# Patient Record
Sex: Female | Born: 1970 | Race: White | Hispanic: No | Smoking: Never smoker
Health system: Southern US, Community
[De-identification: ages and names within clinical notes are randomized; demographics above are authoritative.]

## PROBLEM LIST (undated history)

## (undated) DIAGNOSIS — M7989 Other specified soft tissue disorders: Secondary | ICD-10-CM

## (undated) DIAGNOSIS — F329 Major depressive disorder, single episode, unspecified: Secondary | ICD-10-CM

## (undated) DIAGNOSIS — M1990 Unspecified osteoarthritis, unspecified site: Secondary | ICD-10-CM

## (undated) DIAGNOSIS — F32A Depression, unspecified: Secondary | ICD-10-CM

## (undated) HISTORY — DX: Unspecified osteoarthritis, unspecified site: M19.90

## (undated) HISTORY — DX: Depression, unspecified: F32.A

## (undated) HISTORY — DX: Other specified soft tissue disorders: M79.89

## (undated) HISTORY — PX: TUBAL LIGATION: SHX77

## (undated) HISTORY — DX: Major depressive disorder, single episode, unspecified: F32.9

## (undated) HISTORY — PX: CHOLECYSTECTOMY: SHX55

---

## 1997-06-19 ENCOUNTER — Emergency Department (HOSPITAL_COMMUNITY): Admission: EM | Admit: 1997-06-19 | Discharge: 1997-06-19 | Payer: Self-pay | Admitting: *Deleted

## 1997-10-07 ENCOUNTER — Emergency Department (HOSPITAL_COMMUNITY): Admission: EM | Admit: 1997-10-07 | Discharge: 1997-10-07 | Payer: Self-pay | Admitting: Emergency Medicine

## 1998-05-08 ENCOUNTER — Other Ambulatory Visit: Admission: RE | Admit: 1998-05-08 | Discharge: 1998-05-08 | Payer: Self-pay | Admitting: Gynecology

## 1998-08-20 ENCOUNTER — Emergency Department (HOSPITAL_COMMUNITY): Admission: EM | Admit: 1998-08-20 | Discharge: 1998-08-21 | Payer: Self-pay | Admitting: Emergency Medicine

## 1998-10-11 ENCOUNTER — Other Ambulatory Visit: Admission: RE | Admit: 1998-10-11 | Discharge: 1998-10-11 | Payer: Self-pay | Admitting: Gynecology

## 1998-12-26 ENCOUNTER — Emergency Department (HOSPITAL_COMMUNITY): Admission: EM | Admit: 1998-12-26 | Discharge: 1998-12-26 | Payer: Self-pay | Admitting: *Deleted

## 1999-02-07 ENCOUNTER — Encounter: Payer: Self-pay | Admitting: Gynecology

## 1999-02-07 ENCOUNTER — Ambulatory Visit (HOSPITAL_COMMUNITY): Admission: RE | Admit: 1999-02-07 | Discharge: 1999-02-07 | Payer: Self-pay | Admitting: Gynecology

## 1999-02-15 ENCOUNTER — Encounter: Payer: Self-pay | Admitting: Gynecology

## 1999-02-15 ENCOUNTER — Ambulatory Visit (HOSPITAL_COMMUNITY): Admission: RE | Admit: 1999-02-15 | Discharge: 1999-02-15 | Payer: Self-pay | Admitting: Gynecology

## 1999-03-29 ENCOUNTER — Inpatient Hospital Stay (HOSPITAL_COMMUNITY): Admission: AD | Admit: 1999-03-29 | Discharge: 1999-03-29 | Payer: Self-pay | Admitting: Gynecology

## 1999-04-18 ENCOUNTER — Inpatient Hospital Stay (HOSPITAL_COMMUNITY): Admission: AD | Admit: 1999-04-18 | Discharge: 1999-04-18 | Payer: Self-pay | Admitting: Gynecology

## 1999-04-20 ENCOUNTER — Inpatient Hospital Stay (HOSPITAL_COMMUNITY): Admission: AD | Admit: 1999-04-20 | Discharge: 1999-04-20 | Payer: Self-pay | Admitting: Gynecology

## 1999-04-23 ENCOUNTER — Encounter: Payer: Self-pay | Admitting: Gynecology

## 1999-04-23 ENCOUNTER — Encounter (HOSPITAL_COMMUNITY): Admission: EM | Admit: 1999-04-23 | Discharge: 1999-05-09 | Payer: Self-pay | Admitting: Internal Medicine

## 1999-04-25 ENCOUNTER — Inpatient Hospital Stay (HOSPITAL_COMMUNITY): Admission: AD | Admit: 1999-04-25 | Discharge: 1999-04-25 | Payer: Self-pay | Admitting: Gynecology

## 1999-05-08 ENCOUNTER — Inpatient Hospital Stay (HOSPITAL_COMMUNITY): Admission: AD | Admit: 1999-05-08 | Discharge: 1999-05-11 | Payer: Self-pay | Admitting: Gynecology

## 1999-06-20 ENCOUNTER — Other Ambulatory Visit: Admission: RE | Admit: 1999-06-20 | Discharge: 1999-06-20 | Payer: Self-pay | Admitting: Gynecology

## 2000-03-05 ENCOUNTER — Other Ambulatory Visit: Admission: RE | Admit: 2000-03-05 | Discharge: 2000-03-05 | Payer: Self-pay | Admitting: *Deleted

## 2000-03-05 ENCOUNTER — Encounter: Admission: RE | Admit: 2000-03-05 | Discharge: 2000-03-05 | Payer: Self-pay | Admitting: Family Medicine

## 2000-04-15 ENCOUNTER — Encounter: Admission: RE | Admit: 2000-04-15 | Discharge: 2000-04-15 | Payer: Self-pay | Admitting: Family Medicine

## 2000-04-15 ENCOUNTER — Ambulatory Visit (HOSPITAL_COMMUNITY): Admission: RE | Admit: 2000-04-15 | Discharge: 2000-04-15 | Payer: Self-pay | Admitting: *Deleted

## 2000-05-14 ENCOUNTER — Encounter: Admission: RE | Admit: 2000-05-14 | Discharge: 2000-05-14 | Payer: Self-pay | Admitting: Family Medicine

## 2000-06-02 ENCOUNTER — Encounter: Admission: RE | Admit: 2000-06-02 | Discharge: 2000-06-02 | Payer: Self-pay | Admitting: Family Medicine

## 2000-06-11 ENCOUNTER — Ambulatory Visit (HOSPITAL_COMMUNITY): Admission: RE | Admit: 2000-06-11 | Discharge: 2000-06-11 | Payer: Self-pay | Admitting: Family Medicine

## 2000-06-19 ENCOUNTER — Encounter: Admission: RE | Admit: 2000-06-19 | Discharge: 2000-06-19 | Payer: Self-pay | Admitting: Family Medicine

## 2000-06-20 ENCOUNTER — Inpatient Hospital Stay (HOSPITAL_COMMUNITY): Admission: AD | Admit: 2000-06-20 | Discharge: 2000-06-20 | Payer: Self-pay | Admitting: *Deleted

## 2000-07-02 ENCOUNTER — Encounter: Admission: RE | Admit: 2000-07-02 | Discharge: 2000-07-02 | Payer: Self-pay | Admitting: Family Medicine

## 2000-07-14 ENCOUNTER — Ambulatory Visit (HOSPITAL_COMMUNITY): Admission: RE | Admit: 2000-07-14 | Discharge: 2000-07-14 | Payer: Self-pay | Admitting: *Deleted

## 2000-08-01 ENCOUNTER — Encounter: Admission: RE | Admit: 2000-08-01 | Discharge: 2000-08-01 | Payer: Self-pay | Admitting: Family Medicine

## 2000-08-19 ENCOUNTER — Encounter: Admission: RE | Admit: 2000-08-19 | Discharge: 2000-08-19 | Payer: Self-pay | Admitting: Family Medicine

## 2000-09-01 ENCOUNTER — Encounter: Admission: RE | Admit: 2000-09-01 | Discharge: 2000-09-01 | Payer: Self-pay | Admitting: Family Medicine

## 2000-09-19 ENCOUNTER — Encounter: Admission: RE | Admit: 2000-09-19 | Discharge: 2000-09-19 | Payer: Self-pay | Admitting: Family Medicine

## 2000-09-29 ENCOUNTER — Encounter: Payer: Self-pay | Admitting: Family Medicine

## 2000-09-29 ENCOUNTER — Ambulatory Visit (HOSPITAL_COMMUNITY): Admission: RE | Admit: 2000-09-29 | Discharge: 2000-09-29 | Payer: Self-pay | Admitting: Family Medicine

## 2000-10-02 ENCOUNTER — Encounter: Admission: RE | Admit: 2000-10-02 | Discharge: 2000-10-02 | Payer: Self-pay | Admitting: Sports Medicine

## 2000-10-04 ENCOUNTER — Observation Stay (HOSPITAL_COMMUNITY): Admission: AD | Admit: 2000-10-04 | Discharge: 2000-10-05 | Payer: Self-pay | Admitting: Obstetrics

## 2000-10-04 ENCOUNTER — Encounter: Payer: Self-pay | Admitting: Obstetrics

## 2000-10-05 ENCOUNTER — Encounter: Payer: Self-pay | Admitting: Obstetrics

## 2000-10-07 ENCOUNTER — Encounter: Admission: RE | Admit: 2000-10-07 | Discharge: 2000-10-07 | Payer: Self-pay | Admitting: Family Medicine

## 2000-10-14 ENCOUNTER — Inpatient Hospital Stay (HOSPITAL_COMMUNITY): Admission: AD | Admit: 2000-10-14 | Discharge: 2000-10-14 | Payer: Self-pay | Admitting: *Deleted

## 2000-10-14 ENCOUNTER — Encounter: Admission: RE | Admit: 2000-10-14 | Discharge: 2000-10-14 | Payer: Self-pay | Admitting: Family Medicine

## 2000-10-21 ENCOUNTER — Encounter: Admission: RE | Admit: 2000-10-21 | Discharge: 2000-10-21 | Payer: Self-pay | Admitting: Family Medicine

## 2000-10-30 ENCOUNTER — Encounter: Admission: RE | Admit: 2000-10-30 | Discharge: 2000-10-30 | Payer: Self-pay | Admitting: Family Medicine

## 2000-10-31 ENCOUNTER — Encounter (HOSPITAL_COMMUNITY): Admission: RE | Admit: 2000-10-31 | Discharge: 2000-10-31 | Payer: Self-pay | Admitting: *Deleted

## 2000-11-02 ENCOUNTER — Inpatient Hospital Stay (HOSPITAL_COMMUNITY): Admission: AD | Admit: 2000-11-02 | Discharge: 2000-11-06 | Payer: Self-pay | Admitting: *Deleted

## 2000-11-02 ENCOUNTER — Encounter (INDEPENDENT_AMBULATORY_CARE_PROVIDER_SITE_OTHER): Payer: Self-pay

## 2000-12-07 ENCOUNTER — Encounter (INDEPENDENT_AMBULATORY_CARE_PROVIDER_SITE_OTHER): Payer: Self-pay | Admitting: *Deleted

## 2000-12-07 LAB — CONVERTED CEMR LAB

## 2000-12-18 ENCOUNTER — Encounter (INDEPENDENT_AMBULATORY_CARE_PROVIDER_SITE_OTHER): Payer: Self-pay

## 2000-12-18 ENCOUNTER — Encounter: Admission: RE | Admit: 2000-12-18 | Discharge: 2000-12-18 | Payer: Self-pay | Admitting: Family Medicine

## 2000-12-25 ENCOUNTER — Encounter: Admission: RE | Admit: 2000-12-25 | Discharge: 2000-12-25 | Payer: Self-pay | Admitting: Family Medicine

## 2001-02-17 ENCOUNTER — Encounter: Admission: RE | Admit: 2001-02-17 | Discharge: 2001-02-17 | Payer: Self-pay | Admitting: Family Medicine

## 2001-04-14 ENCOUNTER — Encounter: Admission: RE | Admit: 2001-04-14 | Discharge: 2001-04-14 | Payer: Self-pay | Admitting: Family Medicine

## 2001-06-04 ENCOUNTER — Encounter: Admission: RE | Admit: 2001-06-04 | Discharge: 2001-06-04 | Payer: Self-pay | Admitting: Family Medicine

## 2002-02-15 ENCOUNTER — Encounter: Admission: RE | Admit: 2002-02-15 | Discharge: 2002-02-15 | Payer: Self-pay | Admitting: Family Medicine

## 2002-02-15 ENCOUNTER — Encounter: Admission: RE | Admit: 2002-02-15 | Discharge: 2002-02-15 | Payer: Self-pay | Admitting: Sports Medicine

## 2002-02-15 ENCOUNTER — Encounter: Payer: Self-pay | Admitting: Sports Medicine

## 2002-05-20 ENCOUNTER — Emergency Department (HOSPITAL_COMMUNITY): Admission: EM | Admit: 2002-05-20 | Discharge: 2002-05-20 | Payer: Self-pay | Admitting: Emergency Medicine

## 2002-05-20 ENCOUNTER — Encounter: Payer: Self-pay | Admitting: Emergency Medicine

## 2003-02-25 ENCOUNTER — Emergency Department (HOSPITAL_COMMUNITY): Admission: EM | Admit: 2003-02-25 | Discharge: 2003-02-25 | Payer: Self-pay | Admitting: Emergency Medicine

## 2003-03-10 ENCOUNTER — Emergency Department (HOSPITAL_COMMUNITY): Admission: EM | Admit: 2003-03-10 | Discharge: 2003-03-10 | Payer: Self-pay | Admitting: Emergency Medicine

## 2003-04-12 ENCOUNTER — Encounter: Admission: RE | Admit: 2003-04-12 | Discharge: 2003-04-12 | Payer: Self-pay | Admitting: Family Medicine

## 2003-04-14 ENCOUNTER — Ambulatory Visit (HOSPITAL_COMMUNITY): Admission: RE | Admit: 2003-04-14 | Discharge: 2003-04-14 | Payer: Self-pay | Admitting: Family Medicine

## 2003-08-10 ENCOUNTER — Emergency Department (HOSPITAL_COMMUNITY): Admission: EM | Admit: 2003-08-10 | Discharge: 2003-08-10 | Payer: Self-pay | Admitting: Emergency Medicine

## 2003-11-18 ENCOUNTER — Emergency Department (HOSPITAL_COMMUNITY): Admission: EM | Admit: 2003-11-18 | Discharge: 2003-11-18 | Payer: Self-pay | Admitting: Emergency Medicine

## 2004-03-26 ENCOUNTER — Emergency Department (HOSPITAL_COMMUNITY): Admission: EM | Admit: 2004-03-26 | Discharge: 2004-03-26 | Payer: Self-pay | Admitting: Emergency Medicine

## 2004-11-05 ENCOUNTER — Ambulatory Visit: Payer: Self-pay | Admitting: Hospitalist

## 2004-11-23 ENCOUNTER — Ambulatory Visit (HOSPITAL_BASED_OUTPATIENT_CLINIC_OR_DEPARTMENT_OTHER): Admission: RE | Admit: 2004-11-23 | Discharge: 2004-11-23 | Payer: Self-pay | Admitting: Internal Medicine

## 2004-12-02 ENCOUNTER — Ambulatory Visit: Payer: Self-pay | Admitting: Internal Medicine

## 2004-12-19 ENCOUNTER — Ambulatory Visit: Payer: Self-pay | Admitting: Internal Medicine

## 2004-12-26 ENCOUNTER — Ambulatory Visit: Payer: Self-pay | Admitting: Internal Medicine

## 2005-05-10 ENCOUNTER — Ambulatory Visit: Payer: Self-pay | Admitting: Internal Medicine

## 2005-05-10 ENCOUNTER — Ambulatory Visit (HOSPITAL_COMMUNITY): Admission: RE | Admit: 2005-05-10 | Discharge: 2005-05-10 | Payer: Self-pay | Admitting: Internal Medicine

## 2006-03-07 ENCOUNTER — Encounter (INDEPENDENT_AMBULATORY_CARE_PROVIDER_SITE_OTHER): Payer: Self-pay | Admitting: *Deleted

## 2006-10-21 ENCOUNTER — Emergency Department (HOSPITAL_COMMUNITY): Admission: EM | Admit: 2006-10-21 | Discharge: 2006-10-21 | Payer: Self-pay | Admitting: Emergency Medicine

## 2007-07-17 ENCOUNTER — Encounter: Payer: Self-pay | Admitting: Internal Medicine

## 2009-01-16 ENCOUNTER — Emergency Department (HOSPITAL_COMMUNITY): Admission: EM | Admit: 2009-01-16 | Discharge: 2009-01-16 | Payer: Self-pay | Admitting: Emergency Medicine

## 2009-01-16 ENCOUNTER — Emergency Department (HOSPITAL_COMMUNITY): Admission: EM | Admit: 2009-01-16 | Discharge: 2009-01-16 | Payer: Self-pay | Admitting: *Deleted

## 2010-03-25 LAB — DIFFERENTIAL
Basophils Absolute: 0.1 10*3/uL (ref 0.0–0.1)
Eosinophils Relative: 1 % (ref 0–5)
Lymphocytes Relative: 21 % (ref 12–46)
Lymphs Abs: 2.1 10*3/uL (ref 0.7–4.0)
Neutro Abs: 7.3 10*3/uL (ref 1.7–7.7)

## 2010-03-25 LAB — CBC
HCT: 43.1 % (ref 36.0–46.0)
Platelets: 313 10*3/uL (ref 150–400)
RDW: 13.3 % (ref 11.5–15.5)
WBC: 10 10*3/uL (ref 4.0–10.5)

## 2010-03-25 LAB — RPR: RPR Ser Ql: NONREACTIVE

## 2010-03-25 LAB — ANGIOTENSIN CONVERTING ENZYME: Angiotensin-Converting Enzyme: 56 U/L (ref 9–67)

## 2010-05-25 NOTE — Discharge Summary (Signed)
[facility]  Patient:    Jenny Mccormick, Jenny Mccormick                  MRN: [id number] Adm. Date:  [id number] Disc. Date: [id number] Attending:  Merrily Pew Dictator:   Antony Contras, RNC, [facility]                           Discharge Summary  DISCHARGE DIAGNOSES:          1. Intrauterine pregnancy at 40 weeks.                               2. Obesity.                               3. Delivery of a viable infant.  PROCEDURE:                    Normal spontaneous vaginal delivery of a viable infant.  Removal of hymenal tag.  HISTORY OF PRESENT ILLNESS:   The patient is a 40 year old, gravida 3, para 1-0-1-1, with an LMP of August 01, 1998, [facility] May 07, 1999.  Prenatal course complicated by obesity.  The patient weighs over 350 pounds.  PRENATAL LABORATORY DATA:     Blood type O positive, antibody screen negative, rubella immune.  RPR, HBSAG, and HIV nonreactive.  MSAFP within normal limits.  Glucose screen was negative.  GBS negative.  HOSPITAL COURSE:              The patient was admitted with spontaneous rupture of membranes on May 08, 1999.  Clear amniotic fluid.  At this time, cervical examination 1 to 2 cm dilated, 50% effaced, and -3.  Labor progressed to complete dilatation.  She was delivered of a 7 pound 8 ounce viable female infant, Apgars 9 and 9.  No episiotomy.  A posterior forchette finger-like hymenal tag was removed, which the patient had requested prior to delivery.  Estimated blood loss was less than 500 cc.  Postpartum course, the patient remained afebrile with no difficulty voiding.  She did have some pain in her right thigh.  She was evaluated by the anesthesiologist and felt like this could possibly be secondary to residual epidural effect.  This did resolved over the course of her hospital stay.  Her postpartum CBC; hematocrit was 31.7, hemoglobin 10.6, White blood cells 8.3, and platelets 238.  She did have a positive  RPR on her admission labs, but the TPPA was negative.  She was able to be discharged on her second postpartum day in satisfactory condition.  DISPOSITION:                  She is to be followed up in six weeks for a postpartum checkup, continue prenatal vitamins and iron.  Stool softeners. Motrin and Tylenol for pain. DD:  05/25/99 TD:  05/26/99 Job: [id number]

## 2010-05-25 NOTE — Procedures (Signed)
Jenny Mccormick, Jenny Mccormick              ACCOUNT NO.:  [id number]   MEDICAL RECORD NO.:  [id number]          PATIENT TYPE:  OUT   LOCATION:  SLEEP CENTER                 FACILITY:  [facility]   PHYSICIAN:  Clinton D. Maple Hudson, M.D. DATE OF BIRTH:  Aug 07, 1970   DATE OF STUDY:  11/23/2004                              NOCTURNAL POLYSOMNOGRAM   REFERRING PHYSICIAN:  Dr. Clent Demark.   DATE OF STUDY:  11/23/2004.   INDICATIONS FOR STUDY:  Hypersomnia with sleep apnea. Epworth sleepiness  score 10/24, BMI 63.9. Weight 408 pounds.   SLEEP ARCHITECTURE:  Total sleep time 327 minutes with sleep efficiency 90%.  Stage 1 was 6%, stage 2 was 51%, stages 3 and 4 were 15%, REM 29% of total  sleep time. Sleep latency 7 minutes, REM latency 65 minutes, awake after  sleep onset 30 minutes, arousal index 14. No bedtime medication taken.   RESPIRATORY DATA:  Apnea hypopnea index (AHI, RDI) 4.8 per hour which is  within the upper limits of adult normal (0 to 5 per hour). This included one  central apnea, 10 obstructive apneas and 15 hypopneas. Events were equally  common while supine and sleeping on left side. REM AHI  10.2 per hour.   OXYGEN DATA:  Moderate snoring with oxygen desaturation to a nadir of 89%.  Mean oxygen saturation through the study was 95% on room air.   CARDIAC DATA:  Normal sinus rhythm.   MOVEMENT/PARASOMNIA:  A total of 57 limb jerks were reported of which seven  were associated with arousal or awakening for periodic limb movement with  arousal index of 1.3 per hour which is unremarkable.   IMPRESSION/RECOMMENDATIONS:  1.  Occasional sleep disordered breathing events, AHI 4.8 per hour, within      normal limits and not positional.  2.  Moderate snoring with oxygen desaturation minimally to 89% and normal      mean oxygen saturation of 95% through the study.  3.  Occasional periodic limb movement with arousal, 1.3 per hour.     Clinton D. Maple Hudson, M.D.  Diplomate, Biomedical engineer of  Sleep Medicine  Electronically Signed    CDY/MEDQ  D:  12/02/2004 09:26:32  T:  12/02/2004 22:15:12  Job:  [id number]

## 2010-05-25 NOTE — Op Note (Signed)
[facility]  Patient:    Jenny Mccormick, Jenny Mccormick Visit Number: [id number] MRN: [id number]          Service Type: OBS Location: 910A 9135 01 Attending Physician:  Michaelle Copas Dictated by:   Conni Elliot, M.D. Proc. Date: 11/04/00 Admit Date:  11/02/2000 Discharge Date: 11/06/2000                             Operative Report  PREOPERATIVE DIAGNOSIS:       Desires surgical sterilization.  POSTOPERATIVE DIAGNOSIS:      Desires surgical sterilization.  OPERATION:                    Modified bilateral Pomeroy tubal ligation.  SURGEON:                      Conni Elliot, M.D.  ASSISTANT:                    Juanell Fairly, M.D.  ANESTHESIA:                   Initially continuous lumbar epidural but was inadequate so went to general endotracheal anesthesia.  DESCRIPTION OF PROCEDURE:     After placing the patient under general endotracheal anesthesia, the abdomen was prepped and draped in the sterile fashion.  A periumbilical incision was made and incision carried down through the subcutaneous tissue and fascia.  The peritoneal cavity was entered.  The right fallopian tube was identified, grasped with a Babcock clamp and followed out to the fimbriated end.  The fallopian tube was brought to the operating field.  It suture ligated and an approximately 1 cm segment was excised. Hemostasis was adequate.  A similar procedure was done on the opposite side. The anterior peritoneum and fascia and subcutaneous tissue closed in the standard fashion.  Estimated blood loss was less than 10 cc.  Sponge, needle and lap counts were correct. Dictated by:   Conni Elliot, M.D. Attending Physician:  Michaelle Copas DD:  01/15/01 TD:  01/15/01 Job: [id number]

## 2010-08-08 DIAGNOSIS — J45909 Unspecified asthma, uncomplicated: Secondary | ICD-10-CM | POA: Insufficient documentation

## 2010-08-08 DIAGNOSIS — M7989 Other specified soft tissue disorders: Secondary | ICD-10-CM | POA: Insufficient documentation

## 2010-08-08 DIAGNOSIS — M7750 Other enthesopathy of unspecified foot: Secondary | ICD-10-CM

## 2010-08-08 DIAGNOSIS — M1990 Unspecified osteoarthritis, unspecified site: Secondary | ICD-10-CM

## 2011-06-30 ENCOUNTER — Ambulatory Visit: Payer: BC Managed Care – PPO

## 2011-06-30 ENCOUNTER — Ambulatory Visit (INDEPENDENT_AMBULATORY_CARE_PROVIDER_SITE_OTHER): Payer: BC Managed Care – PPO | Admitting: Physician Assistant

## 2011-06-30 ENCOUNTER — Encounter: Payer: Self-pay | Admitting: Physician Assistant

## 2011-06-30 VITALS — BP 115/79 | HR 76 | Temp 98.1°F | Resp 20 | Ht 68.5 in | Wt >= 6400 oz

## 2011-06-30 DIAGNOSIS — R059 Cough, unspecified: Secondary | ICD-10-CM

## 2011-06-30 DIAGNOSIS — R079 Chest pain, unspecified: Secondary | ICD-10-CM

## 2011-06-30 DIAGNOSIS — R05 Cough: Secondary | ICD-10-CM

## 2011-06-30 LAB — POCT CBC
Hemoglobin: 12.9 g/dL (ref 12.2–16.2)
MCH, POC: 27.2 pg (ref 27–31.2)
MID (cbc): 0.6 (ref 0–0.9)
MPV: 8.9 fL (ref 0–99.8)
POC Granulocyte: 8.8 — AB (ref 2–6.9)
POC MID %: 4.6 %M (ref 0–12)
Platelet Count, POC: 354 10*3/uL (ref 142–424)
RBC: 4.74 M/uL (ref 4.04–5.48)
WBC: 13 10*3/uL — AB (ref 4.6–10.2)

## 2011-06-30 MED ORDER — ALBUTEROL SULFATE HFA 108 (90 BASE) MCG/ACT IN AERS: 2.0000 | INHALATION_SPRAY | RESPIRATORY_TRACT | Status: DC | PRN

## 2011-06-30 MED ORDER — ALBUTEROL SULFATE (2.5 MG/3ML) 0.083% IN NEBU
2.5000 mg | INHALATION_SOLUTION | Freq: Once | RESPIRATORY_TRACT | Status: AC
  Administered 2011-06-30: 2.5 mg via RESPIRATORY_TRACT

## 2011-06-30 MED ORDER — HYDROCODONE-HOMATROPINE 5-1.5 MG/5ML PO SYRP: 5.0000 mL | ORAL_SOLUTION | ORAL | Status: AC | PRN

## 2011-06-30 MED ORDER — AZITHROMYCIN 250 MG PO TABS: ORAL_TABLET | ORAL | Status: AC
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
## 2011-06-30 NOTE — Patient Instructions (Addendum)
Mucinex DM for day time cough. Use inhaler as directed. Call in the morning with update.

## 2011-07-01 ENCOUNTER — Telehealth: Payer: Self-pay

## 2011-07-01 NOTE — Telephone Encounter (Signed)
PT STATES SHE WAS TOLD BY ALICIA TO GIVE HER A CALL ABOUT HOW SHE IS DOING. SHE HAVE A BAD HEADACHE. PLEASE CALL [phone]

## 2011-07-02 NOTE — Telephone Encounter (Signed)
LMOM to CB. 

## 2011-07-02 NOTE — Telephone Encounter (Signed)
Pt CB and I gave her instr's from [place]. Pt agreed to RTC if her fever returns or her chest tightness worsens again. She started Abx yesterday.

## 2011-07-02 NOTE — Telephone Encounter (Signed)
She can take ibuprofen and use her cough syrup that has pain meds in it.  I'm concerned about her chest discomfort and wheezing.  If she is not better she needs to RTC

## 2011-09-01 ENCOUNTER — Encounter (HOSPITAL_COMMUNITY): Payer: Self-pay | Admitting: Emergency Medicine

## 2011-09-01 ENCOUNTER — Emergency Department (HOSPITAL_COMMUNITY)
Admission: EM | Admit: 2011-09-01 | Discharge: 2011-09-01 | Disposition: A | Discharge status: Home / Self Care | Payer: No Typology Code available for payment source | Attending: Emergency Medicine | Admitting: Emergency Medicine

## 2011-09-01 DIAGNOSIS — M436 Torticollis: Secondary | ICD-10-CM

## 2011-09-01 DIAGNOSIS — J45909 Unspecified asthma, uncomplicated: Secondary | ICD-10-CM | POA: Insufficient documentation

## 2011-09-01 HISTORY — DX: Morbid (severe) obesity due to excess calories: E66.01

## 2011-09-01 MED ORDER — CYCLOBENZAPRINE HCL 10 MG PO TABS: 10.0000 mg | ORAL_TABLET | Freq: Three times a day (TID) | ORAL | Status: AC | PRN
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
## 2011-09-04 NOTE — ED Provider Notes (Signed)
Medical screening examination/treatment/procedure(s) were performed by non-physician practitioner and as supervising physician I was immediately available for consultation/collaboration.  Jones Skene, M.D.     Jones Skene, MD 09/04/11 1331

## 2011-09-12 ENCOUNTER — Ambulatory Visit: Payer: Self-pay | Admitting: Orthopedic Surgery

## 2012-03-06 LAB — HEPATIC FUNCTION PANEL
AST: 15 U/L (ref 13–35)
Alkaline Phosphatase: 128 U/L — AB (ref 25–125)
Bilirubin, Total: 0.3 mg/dL

## 2012-03-06 LAB — BASIC METABOLIC PANEL
BUN: 12 mg/dL (ref 4–21)
Creatinine: 0.7 mg/dL (ref 0.5–1.1)
Glucose: 67 mg/dL

## 2012-03-06 LAB — TSH: TSH: 2.46 u[IU]/mL (ref 0.41–5.90)

## 2012-03-06 LAB — CBC AND DIFFERENTIAL
Platelets: 348 10*3/uL (ref 150–399)
WBC: 8.9 10^3/mL

## 2012-03-06 LAB — LIPID PANEL: LDL Cholesterol: 104 mg/dL

## 2012-04-22 ENCOUNTER — Encounter: Payer: Self-pay | Admitting: Physician Assistant

## 2012-04-22 ENCOUNTER — Ambulatory Visit (INDEPENDENT_AMBULATORY_CARE_PROVIDER_SITE_OTHER): Payer: BC Managed Care – PPO | Admitting: Physician Assistant

## 2012-04-22 ENCOUNTER — Other Ambulatory Visit: Payer: Self-pay | Admitting: Physician Assistant

## 2012-04-22 VITALS — BP 120/70 | HR 73 | Temp 97.9°F | Resp 16 | Ht 67.5 in | Wt >= 6400 oz

## 2012-04-22 DIAGNOSIS — Z1231 Encounter for screening mammogram for malignant neoplasm of breast: Secondary | ICD-10-CM

## 2012-04-22 DIAGNOSIS — Z23 Encounter for immunization: Secondary | ICD-10-CM

## 2012-04-22 DIAGNOSIS — R600 Localized edema: Secondary | ICD-10-CM

## 2012-04-22 DIAGNOSIS — Z0000 Encounter for general adult medical examination without abnormal findings: Secondary | ICD-10-CM

## 2012-04-22 DIAGNOSIS — R609 Edema, unspecified: Secondary | ICD-10-CM

## 2012-04-22 DIAGNOSIS — R6889 Other general symptoms and signs: Secondary | ICD-10-CM

## 2012-04-22 DIAGNOSIS — L989 Disorder of the skin and subcutaneous tissue, unspecified: Secondary | ICD-10-CM

## 2012-04-22 LAB — HEPATIC FUNCTION PANEL
Albumin: 3.8 g/dL (ref 3.5–5.2)
Indirect Bilirubin: 0.3 mg/dL (ref 0.0–0.9)
Total Bilirubin: 0.4 mg/dL (ref 0.3–1.2)
Total Protein: 7.2 g/dL (ref 6.0–8.3)

## 2012-04-22 LAB — POCT URINALYSIS DIPSTICK
Ketones, UA: NEGATIVE
Protein, UA: NEGATIVE
Spec Grav, UA: 1.025
Urobilinogen, UA: 1
pH, UA: 5.5

## 2012-04-22 MED ORDER — HYDROCHLOROTHIAZIDE 25 MG PO TABS: 25.0000 mg | ORAL_TABLET | Freq: Every day | ORAL | Status: AC

## 2012-04-22 NOTE — Progress Notes (Signed)
[address] [place] [address]   Phone [phone]  Subjective:    Patient ID: Jenny Mccormick, female    DOB: [birth year], 42 y.o.   MRN: [id number]  HPI Pt presents to clinic for CPE.  She is mainly here because she had labs at the bariatric clinic that were abnormal and she wanted to make sure nothing was wrong.  She is interested in bariatric surgery but her current insurance will not cover it.  She has been going to the bariatric clinic for about 2 months - she has lost some weight but has lost more body fat.  She is walking daily and trying to monitor her diet.  She has not had a pap in about 11 years.  She has increase her water intake and decrease her soda intake with her new weight loss plan and she has noticed increased swelling in her feet.  It is worse in the pm but not resolved in the am.   Review of Systems  Constitutional: Negative.   HENT: Negative.   Eyes: Negative.   Respiratory: Negative.   Cardiovascular: Positive for leg swelling.  Gastrointestinal: Negative.   Endocrine: Negative.   Genitourinary: Negative.   Musculoskeletal: Negative.   Skin:       Lesion on her back she would like checked out - she has had the birth mark forever but now it has moles growing on it that are changing.  Allergic/Immunologic: Negative.   Neurological: Negative.   Hematological: Negative.   Psychiatric/Behavioral: Negative.        Objective:   Physical Exam  Vitals reviewed. Constitutional: She is oriented to person, place, and time. She appears well-developed and well-nourished.  HENT:  Head: Normocephalic and atraumatic.  Right Ear: Hearing, tympanic membrane, external ear and ear canal normal.  Left Ear: Hearing, tympanic membrane, external ear and ear canal normal.  Nose: Nose normal.  Mouth/Throat: Uvula is midline and oropharynx is clear and moist.  Eyes: Conjunctivae are normal. Pupils are equal, round, and reactive to light.  Neck: Normal range of motion. Neck  supple. No thyromegaly present.  Cardiovascular: Normal rate, regular rhythm, normal heart sounds and intact distal pulses.   No murmur heard. Pulmonary/Chest: Effort normal and breath sounds normal.  Abdominal: Soft. Bowel sounds are normal.  Genitourinary: No breast swelling, discharge or bleeding. Pelvic exam was performed with patient supine. There is no rash, tenderness, lesion or injury on the right labia. There is no rash, tenderness, lesion or injury on the left labia. Cervix exhibits no motion tenderness. No erythema, tenderness or bleeding around the vagina. No foreign body around the vagina. No signs of injury around the vagina. No vaginal discharge found.  This was a very difficult exam due to the patient's size.  The cervix was only partially visualized.  The pap was a blind pap because I could not see the cervical os.  Musculoskeletal: Normal range of motion.       Right lower leg: She exhibits edema (2+ pitting).       Left lower leg: She exhibits edema (2+ pitting).  Lymphadenopathy:    She has no cervical adenopathy.  Neurological: She is alert and oriented to person, place, and time. She has normal reflexes.  Skin: Skin is warm and dry.     Psychiatric: She has a normal mood and affect. Her behavior is normal. Judgment and thought content normal.          Assessment & Plan:  Annual physical  exam - Plan: Hepatic function panel, Pap IG and HPV (high risk) DNA detection, POCT urinalysis dipstick, MM Digital Screening,   Morbid obesity - pt to continue her weight loss endeavors.  She will attempt possible bariatric surgery with her new insurance.  Immunization, tetanus toxoid - Plan: Tdap vaccine greater than or equal to 7yo IM  Fluid collection (edema) in the arms, legs, hands and feet -Pt swelling is related to her weight and will hopefully improve as her weight decreases.  She is to get some support socks that will help a lot.  She will elevated her legs at work and try  to increase her movement while at work. Plan: hydrochlorothiazide (HYDRODIURIL) 25 MG tablet  She is to monitor her BP carefully because it is low normal today and I do not want it to drop and cause problems for the patient.  Skin lesion of back - I am concerned about the lesions because it is a birth mark that has overlying moles.  Plan: Ambulatory referral to Dermatology  Benny Lennert PA-C 04/23/2012 2:22 PM

## 2012-04-23 ENCOUNTER — Encounter: Payer: Self-pay | Admitting: Physician Assistant

## 2012-04-23 NOTE — Addendum Note (Signed)
Addended by: Morrell Riddle on: 04/23/2012 03:53 PM   Modules accepted: Orders

## 2012-04-24 LAB — PAP IG AND HPV HIGH-RISK

## 2012-05-08 ENCOUNTER — Ambulatory Visit: Payer: Self-pay

## 2012-06-19 ENCOUNTER — Ambulatory Visit: Payer: Self-pay

## 2012-07-27 ENCOUNTER — Ambulatory Visit (INDEPENDENT_AMBULATORY_CARE_PROVIDER_SITE_OTHER): Payer: Managed Care, Other (non HMO) | Admitting: Family Medicine

## 2012-07-27 VITALS — BP 128/78 | HR 74 | Temp 98.1°F | Resp 18 | Ht 68.0 in | Wt >= 6400 oz

## 2012-07-27 DIAGNOSIS — G479 Sleep disorder, unspecified: Secondary | ICD-10-CM

## 2012-07-27 DIAGNOSIS — F32A Depression, unspecified: Secondary | ICD-10-CM

## 2012-07-27 DIAGNOSIS — F329 Major depressive disorder, single episode, unspecified: Secondary | ICD-10-CM

## 2012-07-27 MED ORDER — FLUOXETINE HCL 20 MG PO TABS: 20.0000 mg | ORAL_TABLET | Freq: Every day | ORAL | Status: AC

## 2012-07-27 MED ORDER — LORAZEPAM 1 MG PO TABS: 1.0000 mg | ORAL_TABLET | Freq: Two times a day (BID) | ORAL | Status: AC | PRN

## 2012-07-27 NOTE — Patient Instructions (Addendum)
Counselling:      Karmen Bongo [phone]      Nicole Cella  [phone]      Thamas Hedding [phone]      Frances Nickels [phone]  Take Fluoxetine 20mg  one daily for depression  Take sleeping medication one at bedtime as needed  Return in 3 weeks for followup with Benny Lennert PA

## 2012-07-27 NOTE — Progress Notes (Signed)
Subjective: Patient is struggling with depression and sleep disturbance. She's been crying a lot. Her 40 year old daughter who has been a very problems child is now pregnant. She is locked up in a detention center in central [place]. She just found out the door was pregnant [redacted] weeks ago. She is not sleeping. She's crying a lot. She has not seen anyone for counseling. She is having stresses between her husband.  Objective: Obese lady who is crying. Her included normal ears. Eyes PERRLA. Throat clear. Neck supple. Chest clear. Heart regular.  Assessment: Anxiety and depression and sleep disturbance it  Plan: Gave her names of counselors she can consider contacting. Fluoxetine 20 mg daily Ambien 1 mg at bedtime  Return in about 3 weeks. Suggested she see Benny Lennert PA back at that time.

## 2012-11-12 ENCOUNTER — Other Ambulatory Visit: Payer: Self-pay

## 2016-11-10 ENCOUNTER — Emergency Department (HOSPITAL_BASED_OUTPATIENT_CLINIC_OR_DEPARTMENT_OTHER): Payer: No Typology Code available for payment source

## 2016-11-10 ENCOUNTER — Other Ambulatory Visit: Payer: Self-pay

## 2016-11-10 ENCOUNTER — Encounter (HOSPITAL_BASED_OUTPATIENT_CLINIC_OR_DEPARTMENT_OTHER): Payer: Self-pay | Admitting: *Deleted

## 2016-11-10 ENCOUNTER — Emergency Department (HOSPITAL_BASED_OUTPATIENT_CLINIC_OR_DEPARTMENT_OTHER)
Admission: EM | Admit: 2016-11-10 | Discharge: 2016-11-10 | Disposition: A | Discharge status: Home / Self Care | Payer: No Typology Code available for payment source | Attending: Emergency Medicine | Admitting: Emergency Medicine

## 2016-11-10 DIAGNOSIS — Z79899 Other long term (current) drug therapy: Secondary | ICD-10-CM | POA: Insufficient documentation

## 2016-11-10 DIAGNOSIS — J45909 Unspecified asthma, uncomplicated: Secondary | ICD-10-CM | POA: Diagnosis not present

## 2016-11-10 DIAGNOSIS — M79641 Pain in right hand: Secondary | ICD-10-CM | POA: Diagnosis not present

## 2016-11-10 DIAGNOSIS — S199XXA Unspecified injury of neck, initial encounter: Secondary | ICD-10-CM | POA: Diagnosis present

## 2016-11-10 DIAGNOSIS — Y929 Unspecified place or not applicable: Secondary | ICD-10-CM | POA: Insufficient documentation

## 2016-11-10 DIAGNOSIS — Y999 Unspecified external cause status: Secondary | ICD-10-CM | POA: Diagnosis not present

## 2016-11-10 DIAGNOSIS — S161XXA Strain of muscle, fascia and tendon at neck level, initial encounter: Secondary | ICD-10-CM | POA: Insufficient documentation

## 2016-11-10 DIAGNOSIS — Y939 Activity, unspecified: Secondary | ICD-10-CM | POA: Diagnosis not present

## 2016-11-10 MED ORDER — NAPROXEN 500 MG PO TABS: 500.0000 mg | ORAL_TABLET | Freq: Two times a day (BID) | ORAL | 0 refills | Status: AC
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
## 2016-11-10 NOTE — Discharge Instructions (Signed)
Take the Naprosyn as directed.  X-ray of the right hand and CT scan of the neck without any acute findings.  Expect to be sore and stiff over the next few days.  Return for any new or worse symptoms.
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
